# Patient Record
Sex: Female | Born: 1965 | Race: Black or African American | Hispanic: No | Marital: Single | Smoking: Never smoker
Health system: Southern US, Community
[De-identification: ages and names within clinical notes are randomized; demographics above are authoritative.]

## PROBLEM LIST (undated history)

## (undated) DIAGNOSIS — I10 Essential (primary) hypertension: Secondary | ICD-10-CM

## (undated) DIAGNOSIS — C55 Malignant neoplasm of uterus, part unspecified: Secondary | ICD-10-CM

## (undated) HISTORY — PX: ABDOMINAL HYSTERECTOMY: SHX81

## (undated) HISTORY — PX: ABDOMINAL SURGERY: SHX537

---

## 2017-12-18 ENCOUNTER — Emergency Department (HOSPITAL_BASED_OUTPATIENT_CLINIC_OR_DEPARTMENT_OTHER)
Admission: EM | Admit: 2017-12-18 | Discharge: 2017-12-18 | Disposition: A | Discharge status: Home / Self Care | Payer: Medicare (Managed Care) | Attending: Emergency Medicine | Admitting: Emergency Medicine

## 2017-12-18 ENCOUNTER — Encounter (HOSPITAL_BASED_OUTPATIENT_CLINIC_OR_DEPARTMENT_OTHER): Payer: Self-pay

## 2017-12-18 ENCOUNTER — Emergency Department (HOSPITAL_BASED_OUTPATIENT_CLINIC_OR_DEPARTMENT_OTHER): Payer: Medicare (Managed Care)

## 2017-12-18 ENCOUNTER — Other Ambulatory Visit: Payer: Self-pay

## 2017-12-18 DIAGNOSIS — I10 Essential (primary) hypertension: Secondary | ICD-10-CM | POA: Diagnosis not present

## 2017-12-18 DIAGNOSIS — R51 Headache: Secondary | ICD-10-CM | POA: Insufficient documentation

## 2017-12-18 DIAGNOSIS — Z8542 Personal history of malignant neoplasm of other parts of uterus: Secondary | ICD-10-CM | POA: Diagnosis not present

## 2017-12-18 DIAGNOSIS — K1121 Acute sialoadenitis: Secondary | ICD-10-CM | POA: Diagnosis not present

## 2017-12-18 DIAGNOSIS — R600 Localized edema: Secondary | ICD-10-CM | POA: Diagnosis present

## 2017-12-18 HISTORY — DX: Essential (primary) hypertension: I10

## 2017-12-18 HISTORY — DX: Malignant neoplasm of uterus, part unspecified: C55

## 2017-12-18 LAB — CBC
HEMATOCRIT: 32.9 % — AB (ref 36.0–46.0)
Hemoglobin: 11 g/dL — ABNORMAL LOW (ref 12.0–15.0)
MCH: 30.6 pg (ref 26.0–34.0)
MCHC: 33.4 g/dL (ref 30.0–36.0)
MCV: 91.6 fL (ref 78.0–100.0)
Platelets: 110 10*3/uL — ABNORMAL LOW (ref 150–400)
RBC: 3.59 MIL/uL — AB (ref 3.87–5.11)
RDW: 17.3 % — ABNORMAL HIGH (ref 11.5–15.5)
WBC: 8.1 10*3/uL (ref 4.0–10.5)

## 2017-12-18 LAB — BASIC METABOLIC PANEL
ANION GAP: 7 (ref 5–15)
BUN: 17 mg/dL (ref 6–20)
CHLORIDE: 105 mmol/L (ref 101–111)
CO2: 27 mmol/L (ref 22–32)
Calcium: 9.1 mg/dL (ref 8.9–10.3)
Creatinine, Ser: 0.68 mg/dL (ref 0.44–1.00)
GFR calc Af Amer: >60 mL/min (ref >60)
GLUCOSE: 97 mg/dL (ref 65–99)
POTASSIUM: 3.8 mmol/L (ref 3.5–5.1)
Sodium: 139 mmol/L (ref 135–145)

## 2017-12-18 MED ORDER — IBUPROFEN 400 MG PO TABS
400.0000 mg | ORAL_TABLET | Freq: Once | ORAL | Status: AC
  Administered 2017-12-18: 400 mg via ORAL
  Filled 2017-12-18: qty 1

## 2017-12-18 MED ORDER — IOPAMIDOL (ISOVUE-300) INJECTION 61%
100.0000 mL | Freq: Once | INTRAVENOUS | Status: AC | PRN
  Administered 2017-12-18: 80 mL via INTRAVENOUS

## 2017-12-18 MED ORDER — AMOXICILLIN-POT CLAVULANATE 875-125 MG PO TABS: 1.0000 | ORAL_TABLET | Freq: Two times a day (BID) | ORAL | 0 refills | Status: AC

## 2017-12-18 MED ORDER — AMOXICILLIN-POT CLAVULANATE 875-125 MG PO TABS
1.0000 | ORAL_TABLET | Freq: Once | ORAL | Status: AC
  Administered 2017-12-18: 1 via ORAL
  Filled 2017-12-18: qty 1

## 2017-12-18 MED FILL — AMOX-CLAV 875-125 MG TABLET: 875-125 | 7 days supply | Qty: 14 | Fill #0

## 2017-12-18 NOTE — Discharge Instructions (Signed)
It was our pleasure to provide your ER care today - we hope that you feel better.  Take augmentin (antibiotic) as prescribed.  Warm compresses to sore area.   Take motrin or aleve as need.   Follow up with ENT doctor in the coming week - call office to arrange appointment.  Your blood pressure is high today - continue your blood pressure medication, and follow up with your doctor in the next 1-2 weeks.   Return to ER if worse, new symptoms, high fevers, severe swelling/spreading redness, other concern.

## 2017-12-18 NOTE — ED Provider Notes (Addendum)
[facility]PART[facility] Provider Note   CSN: [id number] Arrival date & time: 12/18/17  1124     History   Chief Complaint Chief Complaint  Patient presents with  . Otalgia    HPI Lindsey Nelson is a 52 y.o. female.  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 Gets worse, more swollen when eating and drinking. Pain constant, dull, moderate. Hx similar symptoms several yrs ago - states suspected of gland problems/possible stone - states imaging then was negative and resolved w abx tx.  Patient denies injury to area. No fever or chills. No tooth or oral pain. No sore throat. No trouble breathing or swallowing. No ear drainage or hearing loss. No headache.    The history is provided by the patient.  Otalgia  Pertinent negatives include no headaches, no sore throat and no neck pain.    Past Medical History:  Diagnosis Date  . Hypertension   . Uterine cancer ([place])     There are no active problems to display for this patient.   Past Surgical History:  Procedure Laterality Date  . ABDOMINAL HYSTERECTOMY    . ABDOMINAL SURGERY      OB History    No data available       Home Medications    Prior to Admission medications   Not on File    Family History No family history on file.  Social History Social History   Tobacco Use  . Smoking status: Never Smoker  . Smokeless tobacco: Never Used  Substance Use Topics  . Alcohol use: No    Frequency: Never  . Drug use: No     Allergies   Patient has no known allergies.   Review of Systems Review of Systems  Constitutional: Negative for fever.  HENT: Negative for dental problem, sinus pain, sore throat and trouble swallowing.   Respiratory: Negative for shortness of breath.   Musculoskeletal: Negative for neck pain.  Neurological: Negative for headaches.     Physical Exam Updated Vital Signs BP  (!) 171/111 (BP Location: Left Arm)   Pulse 80   Temp 98.2 F (36.8 C) (Oral)   Resp 18   Ht 1.753 m (5\' 9" )   Wt 99.3 kg (219 lb)   SpO2 99%   BMI 32.34 kg/m   Physical Exam  Constitutional: She appears well-developed and well-nourished. No distress.  HENT:  Head: Atraumatic.  Nose: Nose normal.  Mouth/Throat: Oropharynx is clear and moist.  Right face swelling, mild-moderate, pre-auricular and parotid area. No erythema. No fluctuance. Right eac and tm normal.   Eyes: Conjunctivae are normal. No scleral icterus.  Neck: Neck supple. No tracheal deviation present.  Cardiovascular: Normal rate.  Pulmonary/Chest: Effort normal. No respiratory distress.  Abdominal: Normal appearance.  Musculoskeletal: She exhibits no edema.  Lymphadenopathy:    She has no cervical adenopathy.  Neurological: She is alert.  Skin: Skin is warm and dry. No rash noted. She is not diaphoretic.  Psychiatric: She has a normal mood and affect.  Nursing note and vitals reviewed.    ED Treatments / Results  Labs (all labs ordered are listed, but only abnormal results are displayed) Labs Reviewed - No data to display  EKG  EKG Interpretation None       Radiology Ct Maxillofacial W Contrast  Result Date: 12/18/2017 CLINICAL DATA:  Right-sided jaw and throat swelling for the last few days. Difficulty  swallowing. EXAM: CT MAXILLOFACIAL WITH CONTRAST TECHNIQUE: Multidetector CT imaging of the maxillofacial structures was performed with intravenous contrast. Multiplanar CT image reconstructions were also generated. CONTRAST:  30mL ISOVUE-300 IOPAMIDOL (ISOVUE-300) INJECTION 61% COMPARISON:  None. FINDINGS: Osseous: Normal Orbits: Normal Sinuses: No significant sinus disease.  Few retention cysts. Soft tissues: The right parotid gland is slightly swollen compared to the left. No advanced finding. No sign of ductal dilatation or ductal stone. No evidence of mass. Submandibular glands are normal and symmetric  no abnormal lymph nodes. No evidence of mucosal or submucosal lesion in the region imaged. Limited intracranial: Normal IMPRESSION: Mild generalized swelling of the right parotid gland. No evidence of ductal dilatation, ductal stone, mass or regional adenopathy. No other finding. Electronically Signed   By: Nelson Chimes M.D.   On: 12/18/2017 14:22    Procedures Procedures (including critical care time)  Medications Ordered in ED Medications - No data to display   Initial Impression / Assessment and Plan / ED Course  I have reviewed the triage vital signs and the nursing notes.  Pertinent labs & imaging results that were available during my care of the patient were reviewed by me and considered in my medical decision making (see chart for details).  Imaging ordered.   Reviewed nursing notes and prior charts for additional history.   CT with mild inflammation right parotid. No abscess. No stone.  Confirmed nkda. augmentin po.  Warm compresses. augmentin rx.  ent follow up.  Pt indicates she has adequate of her bp meds at home, and will f/u with her pcp her bp.   Return precautions provided.     Final Clinical Impressions(s) / ED Diagnoses   Final diagnoses:  None    ED Discharge Orders    None          Lajean Saver, MD 12/18/17 1456

## 2017-12-18 NOTE — ED Triage Notes (Signed)
C/o pain to right ear with swelling-NAD-steady gait

## 2019-05-30 IMAGING — CT CT MAXILLOFACIAL W/ CM
3 of 4 series · 16 of 47 positions shown, 19 images · IV contrast (iopamidol)
Comparison: None.

CLINICAL DATA: Right-sided jaw and throat swelling for the last few
days. Difficulty swallowing.

EXAM:
CT MAXILLOFACIAL WITH CONTRAST
TECHNIQUE: Multidetector CT imaging of the maxillofacial structures was
performed with intravenous contrast. Multiplanar CT image
reconstructions were also generated.
CONTRAST:  80mL 2MS2L1-JII IOPAMIDOL (2MS2L1-JII) INJECTION 61%

[Series 2: max soft · axial · 0.34mm/px · z∈[-237,-83]mm · 11 of 91 slices shown, 14 images]
[im 7/91  brain]
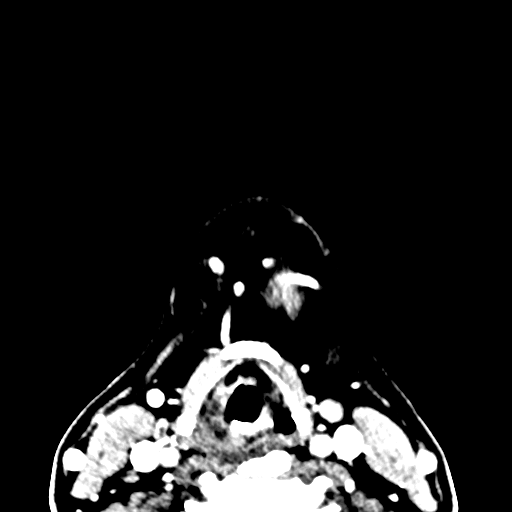
[im 7/91  bone]
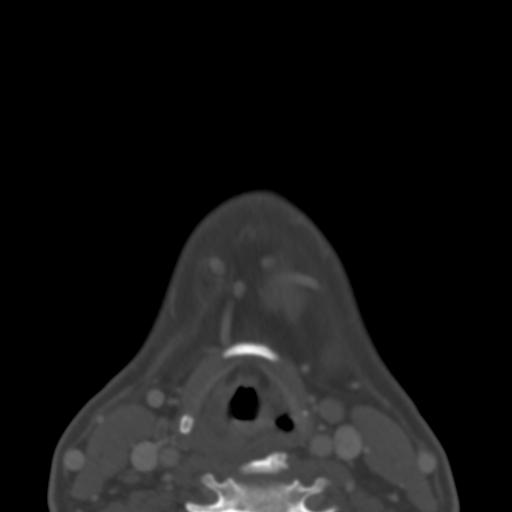
[im 13/91  bone]
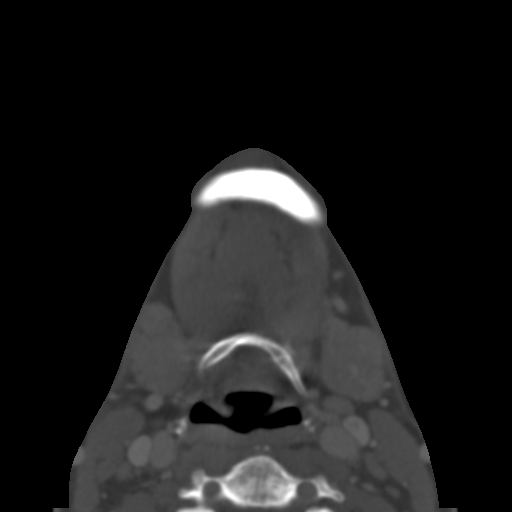
[im 22/91  bone]
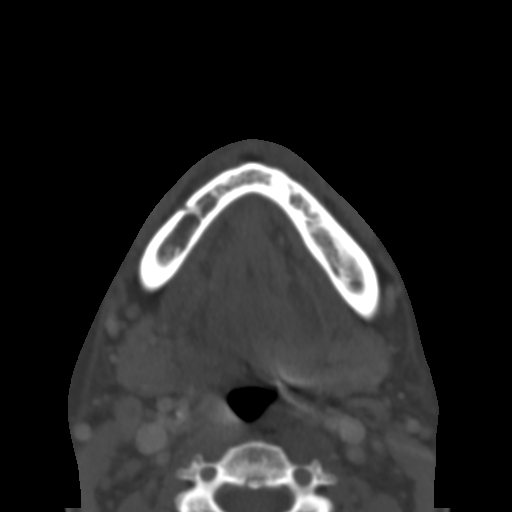
[im 28/91  bone]
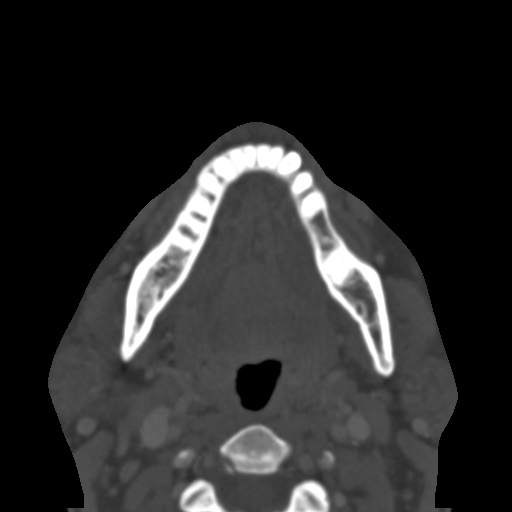
[im 38/91  brain]
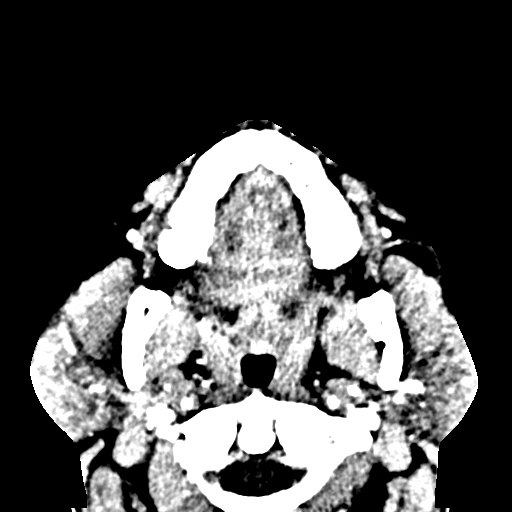
[im 38/91  bone]
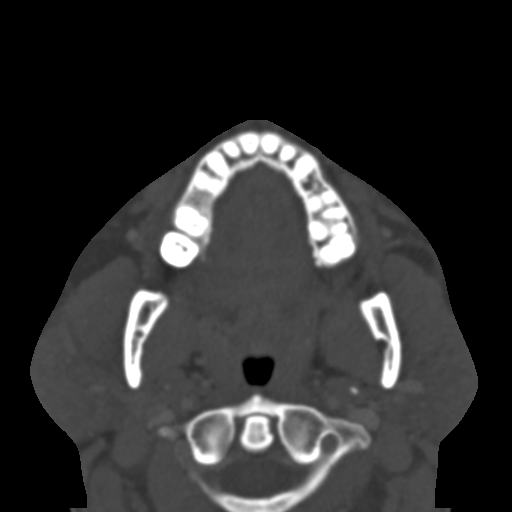
[im 47/91  bone]
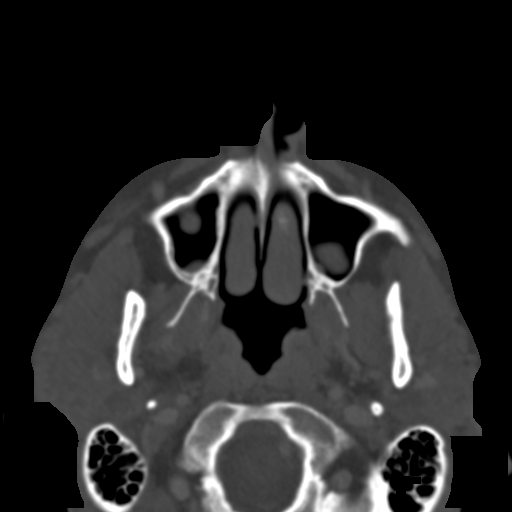
[im 53/91  bone]
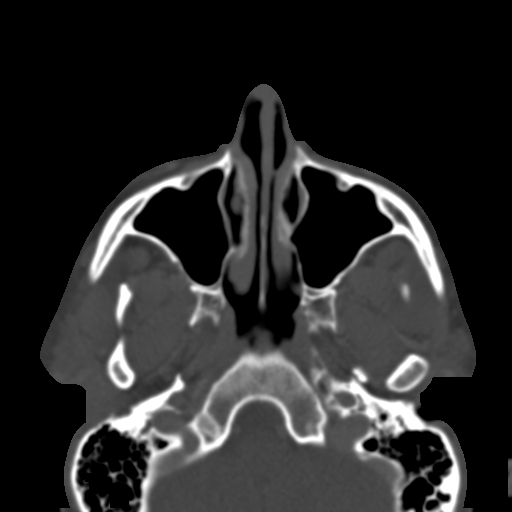
[im 63/91  bone]
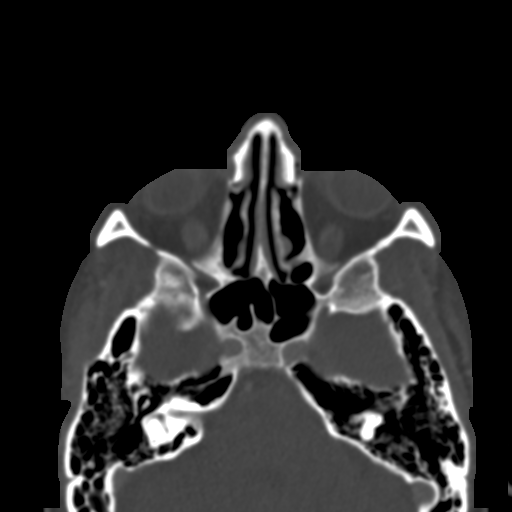
[im 69/91  brain]
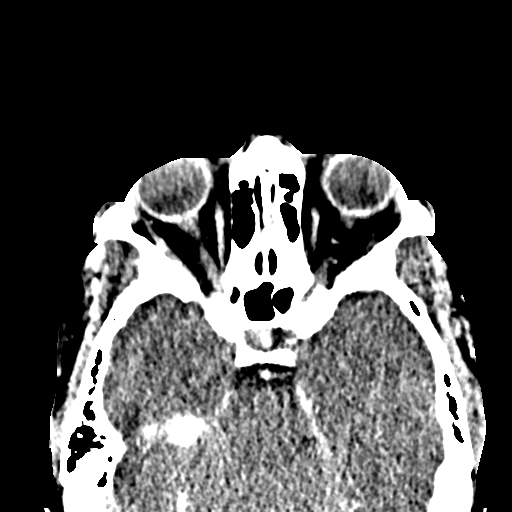
[im 69/91  bone]
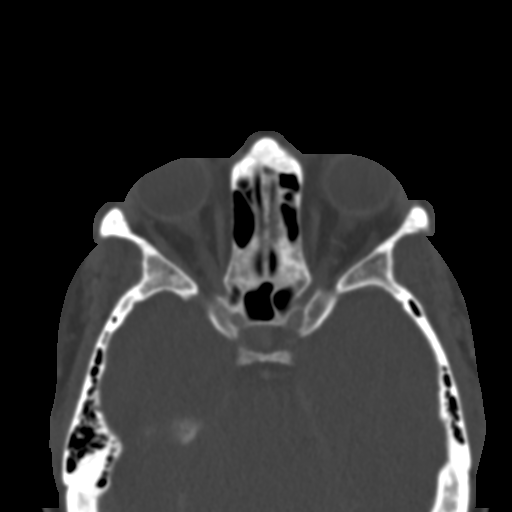
[im 78/91  bone]
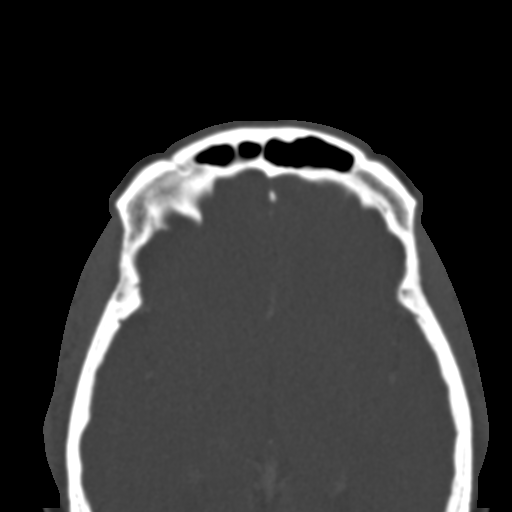
[im 84/91  bone]
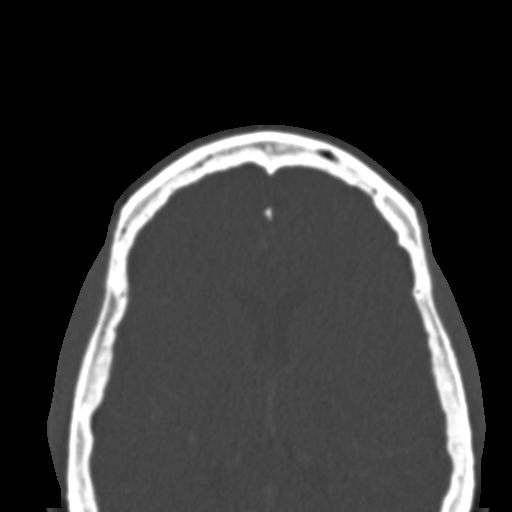

[Series 4: coronal soft · coronal · 0.36mm/px · 3 of 82 slices shown]
[im 28/82  bone]
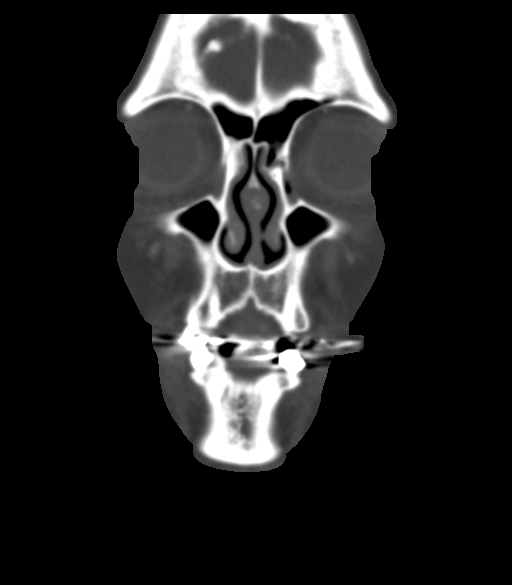
[im 37/82  bone]
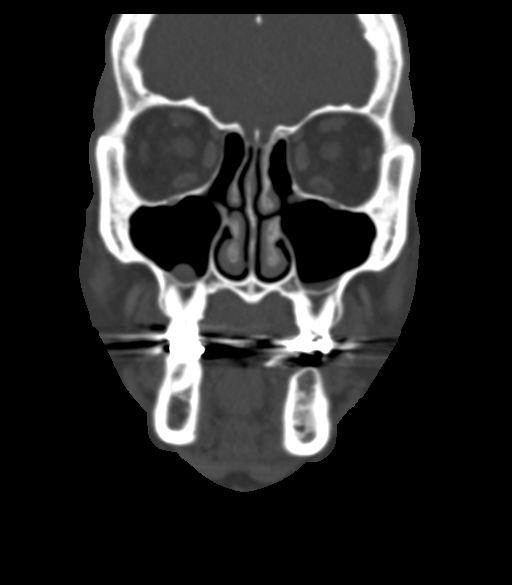
[im 46/82  bone]
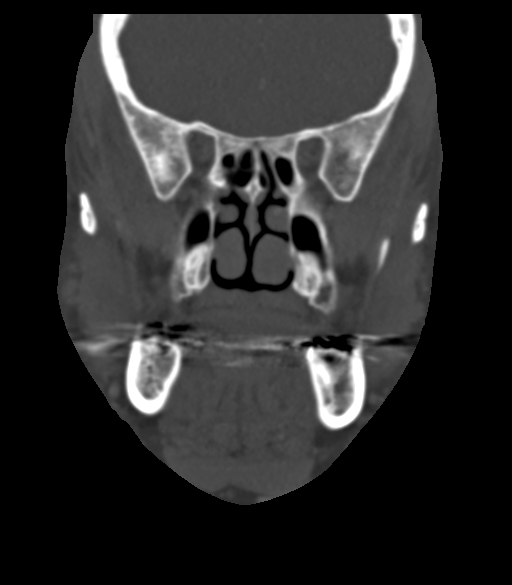

[Series 7: sagittal bone · sagittal · 0.35mm/px · 2 of 90 slices shown]
[im 30/90  bone]
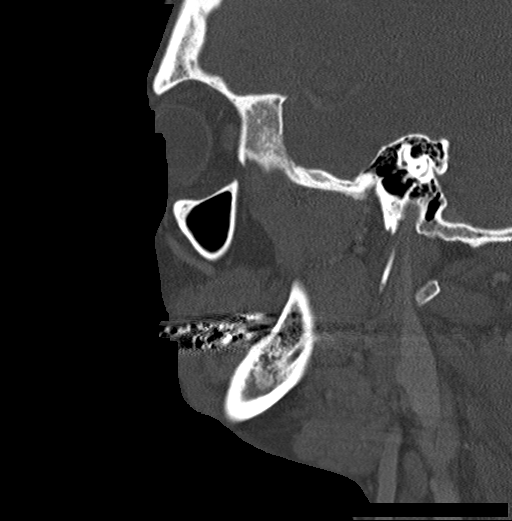
[im 60/90  bone]
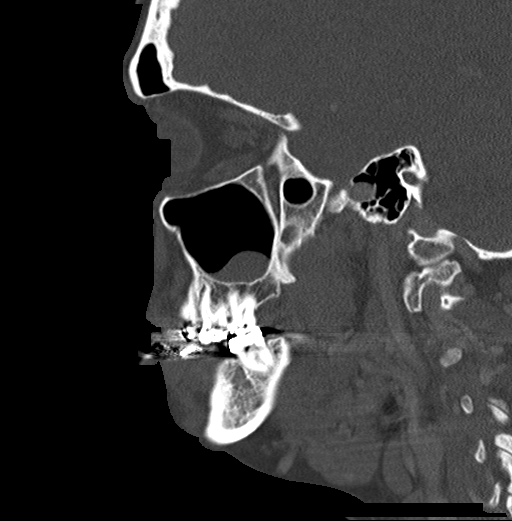

[16 of 47 positions shown; findings below may reference images not displayed]

FINDINGS: Osseous: Normal

Orbits: Normal

Sinuses: No significant sinus disease.  Few retention cysts.

Soft tissues: The right parotid gland is slightly swollen compared
to the left. No advanced finding. No sign of ductal dilatation or
ductal stone. No evidence of mass. Submandibular glands are normal
and symmetric no abnormal lymph nodes. No evidence of mucosal or
submucosal lesion in the region imaged.

Limited intracranial: Normal
IMPRESSION: Mild generalized swelling of the right parotid gland. No evidence of
ductal dilatation, ductal stone, mass or regional adenopathy. No
other finding.

## 2022-03-16 DEATH — deceased
